# Patient Record
Sex: Female | Born: 2000 | Race: Black or African American | Hispanic: No | Marital: Single | Smoking: Never smoker
Health system: Southern US, Community
[De-identification: ages and names within clinical notes are randomized; demographics above are authoritative.]

---
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
## 2022-04-16 ENCOUNTER — Other Ambulatory Visit: Payer: Self-pay

## 2022-04-16 ENCOUNTER — Encounter (HOSPITAL_COMMUNITY): Payer: Self-pay | Admitting: Emergency Medicine

## 2022-04-16 ENCOUNTER — Emergency Department (HOSPITAL_COMMUNITY)
Admission: EM | Admit: 2022-04-16 | Discharge: 2022-04-17 | Discharge status: Against Medical Advice | Payer: Medicaid Other | Attending: Emergency Medicine | Admitting: Emergency Medicine

## 2022-04-16 ENCOUNTER — Emergency Department (HOSPITAL_COMMUNITY): Payer: Medicaid Other

## 2022-04-16 DIAGNOSIS — S0993XA Unspecified injury of face, initial encounter: Secondary | ICD-10-CM | POA: Diagnosis present

## 2022-04-16 DIAGNOSIS — S01511A Laceration without foreign body of lip, initial encounter: Secondary | ICD-10-CM | POA: Insufficient documentation

## 2022-04-16 DIAGNOSIS — R519 Headache, unspecified: Secondary | ICD-10-CM | POA: Diagnosis not present

## 2022-04-16 DIAGNOSIS — Z5321 Procedure and treatment not carried out due to patient leaving prior to being seen by health care provider: Secondary | ICD-10-CM | POA: Insufficient documentation
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
## 2022-04-17 NOTE — ED Notes (Signed)
X3 no response for vitals recheck 

## 2022-07-01 DEATH — deceased
